# Patient Record
Sex: Male | Born: 1985 | Race: Black or African American | Hispanic: No | Marital: Single | Smoking: Current every day smoker
Health system: Southern US, Community
[De-identification: ages and names within clinical notes are randomized; demographics above are authoritative.]

---

## 2014-04-27 ENCOUNTER — Encounter (HOSPITAL_COMMUNITY): Payer: Self-pay | Admitting: Emergency Medicine

## 2014-04-27 ENCOUNTER — Emergency Department (HOSPITAL_COMMUNITY)
Admission: EM | Admit: 2014-04-27 | Discharge: 2014-04-27 | Disposition: A | Discharge status: Home / Self Care | Payer: Self-pay | Attending: Emergency Medicine | Admitting: Emergency Medicine

## 2014-04-27 DIAGNOSIS — K088 Other specified disorders of teeth and supporting structures: Secondary | ICD-10-CM | POA: Insufficient documentation

## 2014-04-27 DIAGNOSIS — K0889 Other specified disorders of teeth and supporting structures: Secondary | ICD-10-CM

## 2014-04-27 DIAGNOSIS — Z720 Tobacco use: Secondary | ICD-10-CM | POA: Insufficient documentation

## 2014-04-27 MED ORDER — ACETAMINOPHEN 325 MG PO TABS: 650.0000 mg | ORAL_TABLET | Freq: Four times a day (QID) | ORAL | Status: AC | PRN

## 2014-04-27 MED ORDER — PENICILLIN V POTASSIUM 250 MG PO TABS
500.0000 mg | ORAL_TABLET | Freq: Once | ORAL | Status: AC
  Administered 2014-04-27: 500 mg via ORAL
  Filled 2014-04-27: qty 2

## 2014-04-27 MED ORDER — ACETAMINOPHEN 325 MG PO TABS
325.0000 mg | ORAL_TABLET | Freq: Once | ORAL | Status: AC
  Administered 2014-04-27: 325 mg via ORAL
  Filled 2014-04-27: qty 1

## 2014-04-27 MED ORDER — HYDROCODONE-ACETAMINOPHEN 5-325 MG PO TABS
1.0000 | ORAL_TABLET | Freq: Once | ORAL | Status: AC
  Administered 2014-04-27: 1 via ORAL
  Filled 2014-04-27: qty 1

## 2014-04-27 MED ORDER — PENICILLIN V POTASSIUM 500 MG PO TABS: 500.0000 mg | ORAL_TABLET | Freq: Three times a day (TID) | ORAL | Status: DC

## 2014-04-27 NOTE — ED Provider Notes (Addendum)
CSN: [id number]     Arrival date & time 04/27/14  0127 History   First MD Initiated Contact with Patient 04/27/14 0505     Chief Complaint  Patient presents with  . Dental Pain      HPI Patient reports worsening left lower first molar pain over the past several days.  Patient has tried Tylenol without significant relief.  No fevers or chills.  No neck pain or neck stiffness.  No other complaints.   History reviewed. No pertinent past medical history. History reviewed. No pertinent past surgical history. No family history on file. History  Substance Use Topics  . Smoking status: Current Every Day Smoker  . Smokeless tobacco: Not on file  . Alcohol Use: Yes    Review of Systems  All other systems reviewed and are negative.     Allergies  Asa and Ibuprofen  Home Medications   Prior to Admission medications   Medication Sig Start Date End Date Taking? Authorizing Provider  acetaminophen (TYLENOL) 325 MG tablet Take 2 tablets (650 mg total) by mouth every 6 (six) hours as needed. 04/27/14   Azalia BilisKevin Roberth Berling, MD  penicillin v potassium (VEETID) 500 MG tablet Take 1 tablet (500 mg total) by mouth 3 (three) times daily. 04/27/14   Azalia BilisKevin Callen Zuba, MD   BP 130/80 mmHg  Pulse 55  Temp(Src) 98.2 F (36.8 C) (Oral)  Resp 18  Ht 5\' 10"  (1.778 m)  Wt 182 lb (82.555 kg)  BMI 26.11 kg/m2  SpO2 98% Physical Exam  Constitutional: He is oriented to person, place, and time. He appears well-developed and well-nourished.  HENT:  Head: Normocephalic.  Obvious dental decay and mild tenderness of the left lower first molar.  No gingival swelling or fluctuance.  Tolerating secretions.  Eyes: EOM are normal.  Neck: Normal range of motion.  Pulmonary/Chest: Effort normal.  Abdominal: He exhibits no distension.  Musculoskeletal: Normal range of motion.  Neurological: He is alert and oriented to person, place, and time.  Psychiatric: He has a normal mood and affect.  Nursing note and vitals  reviewed.   ED Course  Procedures (including critical care time) Labs Review Labs Reviewed - No data to display  Imaging Review No results found.   EKG Interpretation None      MDM   Final diagnoses:  Pain, dental    Dental Pain. Home with antibiotics and pain medicine. Recommend dental follow up. No signs of gingival abscess. Tolerating secretions. Airway patent. No sub lingular swelling     Azalia BilisKevin Laterra Lubinski, MD 04/27/14 [id number]  Azalia BilisKevin Kirtis Challis, MD 05/07/14 [phone]

## 2014-04-27 NOTE — ED Notes (Signed)
Pt. reports left lower molar pain for several days worse today unrelieved by OTC pain medication .

## 2014-04-27 NOTE — Discharge Instructions (Signed)

## 2015-04-06 ENCOUNTER — Encounter (HOSPITAL_COMMUNITY): Payer: Self-pay

## 2015-04-06 ENCOUNTER — Emergency Department (HOSPITAL_COMMUNITY)
Admission: EM | Admit: 2015-04-06 | Discharge: 2015-04-06 | Disposition: A | Discharge status: Home / Self Care | Payer: No Typology Code available for payment source | Attending: Emergency Medicine | Admitting: Emergency Medicine

## 2015-04-06 DIAGNOSIS — F17200 Nicotine dependence, unspecified, uncomplicated: Secondary | ICD-10-CM | POA: Insufficient documentation

## 2015-04-06 DIAGNOSIS — S3992XA Unspecified injury of lower back, initial encounter: Secondary | ICD-10-CM | POA: Diagnosis present

## 2015-04-06 DIAGNOSIS — Y9389 Activity, other specified: Secondary | ICD-10-CM | POA: Insufficient documentation

## 2015-04-06 DIAGNOSIS — Y92410 Unspecified street and highway as the place of occurrence of the external cause: Secondary | ICD-10-CM | POA: Diagnosis not present

## 2015-04-06 DIAGNOSIS — S39012A Strain of muscle, fascia and tendon of lower back, initial encounter: Secondary | ICD-10-CM | POA: Diagnosis not present

## 2015-04-06 DIAGNOSIS — Y998 Other external cause status: Secondary | ICD-10-CM | POA: Insufficient documentation

## 2015-04-06 DIAGNOSIS — Z792 Long term (current) use of antibiotics: Secondary | ICD-10-CM | POA: Insufficient documentation

## 2015-04-06 MED ORDER — HYDROCODONE-ACETAMINOPHEN 5-325 MG PO TABS
1.0000 | ORAL_TABLET | Freq: Once | ORAL | Status: AC
  Administered 2015-04-06: 1 via ORAL
  Filled 2015-04-06: qty 1

## 2015-04-06 MED ORDER — METHOCARBAMOL 500 MG PO TABS: 500.0000 mg | ORAL_TABLET | Freq: Three times a day (TID) | ORAL | Status: DC | PRN

## 2015-04-06 MED ORDER — HYDROCODONE-ACETAMINOPHEN 5-325 MG PO TABS: 1.0000 | ORAL_TABLET | ORAL | Status: DC | PRN

## 2015-04-06 MED ORDER — METHOCARBAMOL 500 MG PO TABS
1000.0000 mg | ORAL_TABLET | Freq: Once | ORAL | Status: AC
  Administered 2015-04-06: 1000 mg via ORAL
  Filled 2015-04-06: qty 2
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
## 2015-04-06 NOTE — Discharge Instructions (Signed)
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
## 2016-06-23 ENCOUNTER — Ambulatory Visit (INDEPENDENT_AMBULATORY_CARE_PROVIDER_SITE_OTHER): Payer: BLUE CROSS/BLUE SHIELD | Admitting: Family Medicine

## 2016-06-23 ENCOUNTER — Encounter: Payer: Self-pay | Admitting: Family Medicine

## 2016-06-23 VITALS — BP 132/86 | HR 75 | Temp 98.4°F | Resp 16 | Ht 70.0 in | Wt 196.0 lb

## 2016-06-23 DIAGNOSIS — G43709 Chronic migraine without aura, not intractable, without status migrainosus: Secondary | ICD-10-CM | POA: Diagnosis not present

## 2016-06-23 DIAGNOSIS — R030 Elevated blood-pressure reading, without diagnosis of hypertension: Secondary | ICD-10-CM | POA: Diagnosis not present

## 2016-06-23 MED ORDER — PROMETHAZINE HCL 25 MG/ML IJ SOLN
25.0000 mg | Freq: Once | INTRAMUSCULAR | Status: AC
  Administered 2016-06-23: 25 mg via INTRAMUSCULAR

## 2016-06-23 MED ORDER — ONDANSETRON 8 MG PO TBDP: 8.0000 mg | ORAL_TABLET | Freq: Three times a day (TID) | ORAL | 0 refills | Status: DC | PRN

## 2016-06-23 MED ORDER — METHYLPREDNISOLONE SODIUM SUCC 125 MG IJ SOLR
60.0000 mg | Freq: Once | INTRAMUSCULAR | Status: AC
  Administered 2016-06-23: 60 mg via INTRAMUSCULAR

## 2016-06-23 MED ORDER — SUMATRIPTAN SUCCINATE 100 MG PO TABS: 50.0000 mg | ORAL_TABLET | Freq: Once | ORAL | 5 refills | Status: DC

## 2016-06-23 MED ORDER — AMITRIPTYLINE HCL 25 MG PO TABS: 25.0000 mg | ORAL_TABLET | Freq: Every day | ORAL | 1 refills | Status: DC

## 2016-06-23 NOTE — Patient Instructions (Addendum)
   IF you received an x-ray today, you will receive an invoice from [facility]. Please contact [facility] at [phone] with questions or concerns regarding your invoice.   IF you received labwork today, you will receive an invoice from [facility]. Please contact [facility] at [phone] with questions or concerns regarding your invoice.   Our billing staff will not be able to assist you with questions regarding bills from these companies.  You will be contacted with the lab results as soon as they are available. The fastest way to get your results is to activate your My Chart account. Instructions are located on the last page of this paperwork. If you have not heard from us regarding the results in 2 weeks, please contact this office.     Recurrent Migraine Headache Migraines are a type of headache, and they are usually stronger and more sudden than normal headaches (tension headaches). Migraines are characterized by an intense pulsing, throbbing pain that is usually only present on one side of the head. Sometimes, migraine headaches can cause nausea, vomiting, sensitivity to light and sound, and vision changes. Recurrent migraines keep coming back (recurring). A migraine can last from 4 hours up to 3 days. What are the causes? The exact cause of this condition is not known. However, a migraine may be caused when nerves in the brain become irritated and release chemicals that cause inflammation of blood vessels. This inflammation causes pain. Certain things may also trigger migraines, such as:  A disruption in your regular eating and sleeping schedule.  Smoking.  Stress.  Menstruation.  Certain foods and drinks, such as: ? Aged cheese. ? Chocolate. ? Alcohol. ? Caffeine. ? Foods or drinks that contain nitrates, glutamate, aspartame, MSG, or tyramine.  Lack of sleep.  Hunger.  Physical exertion.  Fatigue.  High altitude.  Weather  changes.  Medicines, such as: ? Nitroglycerin, which is used to treat chest pain. ? Birth control pills. ? Estrogen. ? Some blood pressure medicines.  What are the signs or symptoms? Symptoms of this condition vary for each person and may include:  Pain that is usually only present on one side of the head. In some cases, the pain may be on both sides of the head or around the head or neck.  Pulsating or throbbing pain.  Severe pain that prevents daily activities.  Pain that is aggravated by any physical activity.  Nausea, vomiting, or both.  Dizziness.  Pain with exposure to bright lights, loud noises, or activity.  General sensitivity to bright lights, loud noises, or smells.  Before you get a migraine, you may get warning signs that a migraine is coming (aura). An aura may include:  Seeing flashing lights.  Seeing bright spots, halos, or zigzag lines.  Having tunnel vision or blurred vision.  Having numbness or a tingling feeling.  Having trouble talking.  Having muscle weakness.  Smelling a certain odor.  How is this diagnosed? This condition is often diagnosed based on:  Your symptoms and medical history.  A physical exam.  You may also have tests, including:  A CT scan or MRI of your brain. These imaging tests cannot diagnose migraines, but they can help to rule out other causes of headaches.  Blood tests.  How is this treated? This condition is treated with:  Medicines. These are used for: ? Lessening pain and nausea. ? Preventing recurrent migraines.  Lifestyle changes, such as changes to your diet or sleeping patterns.  Behavior therapy, such   as relaxation training or biofeedback. Biofeedback is a treatment that involves teaching you to relax and use your brain to lower your heart rate and control your breathing.  Follow these instructions at home: Medicines  Take over-the-counter and prescription medicines only as told by your health care  provider.  Do not drive or use heavy machinery while taking prescription pain medicine. Lifestyle  Do not use any products that contain nicotine or tobacco, such as cigarettes and e-cigarettes. If you need help quitting, ask your health care provider.  Limit alcohol intake to no more than 1 drink a day for nonpregnant women and 2 drinks a day for men. One drink equals 12 oz of beer, 5 oz of wine, or 1 oz of hard liquor.  Get 7-9 hours of sleep each night, or the amount of sleep recommended by your health care provider.  Limit your stress. Talk with your health care provider if you need help with stress management.  Maintain a healthy weight. If you need help losing weight, ask your health care provider.  Exercise regularly. Aim for 150 minutes of moderate-intensity exercise (walking, biking, yoga) or 75 minutes of vigorous exercise (running, circuit training, swimming) each week. General instructions  Keep a journal to find out what triggers your migraine headaches so you can avoid these triggers. For example, write down: ? What you eat and drink. ? How much sleep you get. ? Any change to your diet or medicines.  Lie down in a dark, quiet room when you have a migraine.  Try placing a cool towel over your head when you have a migraine.  Keep lights dim, if bright lights bother you and make your migraines worse.  Keep all follow-up visits as told by your health care provider. This is important. Contact a health care provider if:  Your pain does not improve, even with medicine.  Your migraines continue to return, even with medicine.  You have a fever.  You have weight loss. Get help right away if:  Your migraine becomes severe and medicine does not help.  You have a stiff neck.  You have a loss of vision.  You have muscle weakness or loss of muscle control.  You start losing your balance or have trouble walking.  You feel faint or you pass out.  You develop new,  severe symptoms.  You start having abrupt severe headaches that last for a second or less, like a thunderclap. Summary  Migraine headaches are usually stronger and more sudden than normal headaches (tension headaches). Migraines are characterized by an intense pulsing, throbbing pain that is usually only present on one side of the head.  The exact cause of this condition is not known. However, a migraine may be caused when nerves in the brain become irritated and release chemicals that cause inflammation of blood vessels.  Certain things may trigger migraines, such as changes to diet or sleeping patterns, smoking, certain foods, alcohol, stress, and certain medicines.  Sometimes, migraine headaches can cause nausea, vomiting, sensitivity to light and sound, and vision changes.  Migraines are often diagnosed based on your symptoms, medical history, and a physical exam. This information is not intended to replace advice given to you by your health care provider. Make sure you discuss any questions you have with your health care provider. Document Released: 09/12/2000 Document Revised: 09/30/2015 Document Reviewed: 09/30/2015 Elsevier Interactive Patient Education  2018 [employer] Inc[employer]  

## 2016-06-23 NOTE — Progress Notes (Signed)
Subjective:  By signing my name below, I, Ricky Carpenter, attest that this documentation has been prepared under the direction and in the presence of Ricky Sorenson, MD. Electronically Signed: Stann Carpenter, Scribe. 06/23/2016 , 3:24 PM .  Patient was seen in Room 9 .   Patient ID: Ricky Carpenter, male    DOB: 17-Oct-1985, 31 y.o.   MRN: [id number] Chief Complaint  Patient presents with  . Headache   HPI [name] is a 31 y.o. male who presents to Primary Care at [facility] complaining of headache migraine. Patient has been seen multiple times over the past several years at [name] for headaches. His most recent visit to [facility] was 5 days ago, when he presented with headache same day onset with nausea, vomiting and photosensitivity. His headaches usually respond well to tylenol. His symptoms were consistent with migraine, so he was treated with IM decadron, benadryl, and Reglan. After which, his headache had resolved, and patient was discharged home with 10 tablets of [name]. He has family history of migraines. Patient has never been seen by neurology nor had imaging of his head done for these migraines. His last meal was early this morning.   Patient states he's been having migraines as often as every other day with occasional associated dizziness, blurry vision, fatigue, vomiting and nausea. He's had them start since his teenage years. He's been taking 2 tablets of tylenol and wait 4-6 hours before taking more; however, it's not giving him relief anymore. He's been trying to wait the headache out until night time and takes benadryl. Last night, he had a bad migraine and took benadryl to go to sleep, but woke up with a mild headache this morning. He notes family history of migraines in his mother and his grandmother. He mentions having a slight migraine in office today, rating it 5/10.   He describes his headaches feeling "like a tension headache with  really hard bangs". His migraines usually start on one side and shifts to the other side after taking medication. His headache worsens with loud sounds and bright lights. He usually feels his headaches coming on with a mild point at the front of his head, causing him to "feel crazy". He has lighter headaches before it ramps up and would usually take tylenol. He denies seeing lines or weird smells when his migraines occur.   He wants to have preventative treatment for his migraines, as he works a full-time job and has children to take care of at home (31 year old and 31 year old). He's had taken [name] in the past, which gave him some relief, but it didn't completely knock out his headache. When he was [place], he was prescribed migraine medication when he visited the ER. But when he moved to [place], he hasn't been prescribed medication in the ER. He is concerned with his headaches because he had a friend die after complaining of headaches when patient was 31 years old.   He is allergic to ibuprofen and aspirin. He found out when he was in the ER at 31 years old. He had stitches placed and given ibuprofen. After taking ibuprofen, he started to have facial swelling and difficulty breathing. He was then given aspirin, but only worsened his swelling.   History reviewed. No pertinent past medical history. Prior to Admission medications   Medication Sig Start Date End Date Taking? Authorizing Provider  acetaminophen (TYLENOL) 325 MG tablet Take 2 tablets (  650 mg total) by mouth every 6 (six) hours as needed. 04/27/14   Azalia Bilis, MD  HYDROcodone-acetaminophen (NORCO/VICODIN) 5-325 MG tablet Take 1 tablet by mouth every 4 (four) hours as needed. 04/06/15   Rolland Porter, MD  methocarbamol (ROBAXIN) 500 MG tablet Take 1 tablet (500 mg total) by mouth 3 (three) times daily between meals as needed. 04/06/15   Rolland Porter, MD  penicillin v potassium (VEETID) 500 MG tablet Take 1 tablet (500 mg total) by mouth 3  (three) times daily. 04/27/14   Azalia Bilis, MD   Allergies  Allergen Reactions  . Asa [Aspirin] Anaphylaxis  . Ibuprofen Anaphylaxis   Review of Systems  Constitutional: Positive for fatigue. Negative for unexpected weight change.  Eyes: Negative for visual disturbance.  Respiratory: Negative for cough, chest tightness and shortness of breath.   Cardiovascular: Negative for chest pain, palpitations and leg swelling.  Gastrointestinal: Negative for abdominal pain and blood in stool.  Neurological: Positive for headaches. Negative for dizziness and light-headedness.       Objective:   Physical Exam  Constitutional: He is oriented to person, place, and time. He appears well-developed and well-nourished. No distress.  HENT:  Head: Normocephalic and atraumatic.  Left Ear: Tympanic membrane is injected and retracted.  Right ear impacted with cerumen  Eyes: EOM are normal. Pupils are equal, round, and reactive to light.  Neck: Neck supple.  Cardiovascular: Normal rate.   Pulmonary/Chest: Effort normal. No respiratory distress.  Musculoskeletal: Normal range of motion.  Neurological: He is alert and oriented to person, place, and time.  Skin: Skin is warm and dry.  Psychiatric: He has a normal mood and affect. His behavior is normal.  Nursing note and vitals reviewed.   BP 132/86   Pulse 75   Temp 98.4 F (36.9 C) (Oral)   Resp 16   Ht 5\' 10"  (1.778 m)   Wt 196 lb (88.9 kg)   SpO2 97%   BMI 28.12 kg/m      Assessment & Plan:   1. Chronic migraine without aura without status migrainosus, not intractable   2. Elevated blood pressure reading     Orders Placed This Encounter  Procedures  . 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    Answer:   No Sedation    Order Specific Question:   Does the patient have a pacemaker or implanted devices?    Answer:   No    Order Specific Question:   Preferred imaging location?    Answer:   [facility] (table limit-550lbs)    Order Specific Question:   Radiology Contrast Protocol - do NOT remove file path    Answer:   [email or web address]  . TSH  . CBC with Differential/Platelet  . Comprehensive metabolic panel  . Sedimentation Rate  . C-reactive protein  . Care order/instruction:    Scheduling Instructions:     Recheck BP    Meds ordered this encounter  Medications  . SUMAtriptan (IMITREX) 100 MG tablet    Sig: Take 0.5 tablets (50 mg total) by mouth once. May repeat in 2 hours if headache persists or recurs.    Dispense:  10 tablet    Refill:  5  . amitriptyline (ELAVIL) 25 MG tablet    Sig: Take 1 tablet (25 mg total) by mouth at bedtime.    Dispense:  30 tablet    Refill:  1  . ondansetron (ZOFRAN ODT) 8 MG disintegrating tablet    Sig: Take 1 tablet (8 mg total) by mouth every 8 (eight) hours as needed for nausea or vomiting.    Dispense:  20 tablet    Refill:  0  . promethazine (PHENERGAN) injection 25 mg  . methylPREDNISolone sodium succinate (SOLU-MEDROL) 125 mg/2 mL injection 60 mg    I personally performed the services described in this documentation, which was scribed in my presence. The recorded information has been reviewed and considered, and addended by me as needed.   Ricky SorensonEva Shaw, M.D.  [facility] at [facility]   [address] [place], [place] [address] [phone] phone [phone] fax  06/25/16 10:47 PM

## 2016-06-24 LAB — TSH: TSH: 0.979 u[IU]/mL (ref 0.450–4.500)

## 2016-06-24 LAB — COMPREHENSIVE METABOLIC PANEL
ALT: 20 IU/L (ref 0–44)
AST: 15 IU/L (ref 0–40)
Albumin/Globulin Ratio: 1.6 (ref 1.2–2.2)
Albumin: 4.1 g/dL (ref 3.5–5.5)
Alkaline Phosphatase: 75 IU/L (ref 39–117)
BUN/Creatinine Ratio: 9 (ref 9–20)
BUN: 8 mg/dL (ref 6–20)
Bilirubin Total: <0.2 mg/dL (ref 0.0–1.2)
CO2: 22 mmol/L (ref 20–29)
Calcium: 9.3 mg/dL (ref 8.7–10.2)
Chloride: 105 mmol/L (ref 96–106)
Creatinine, Ser: 0.94 mg/dL (ref 0.76–1.27)
GFR calc non Af Amer: 108 mL/min/{1.73_m2} (ref >59)
GFR, EST AFRICAN AMERICAN: 125 mL/min/{1.73_m2} (ref >59)
GLUCOSE: 107 mg/dL — AB (ref 65–99)
Globulin, Total: 2.6 g/dL (ref 1.5–4.5)
Potassium: 3.8 mmol/L (ref 3.5–5.2)
Sodium: 142 mmol/L (ref 134–144)
TOTAL PROTEIN: 6.7 g/dL (ref 6.0–8.5)

## 2016-06-24 LAB — CBC WITH DIFFERENTIAL/PLATELET
Basophils Absolute: 0 10*3/uL (ref 0.0–0.2)
Basos: 0 %
EOS (ABSOLUTE): 0.3 10*3/uL (ref 0.0–0.4)
Eos: 3 %
HEMOGLOBIN: 14.5 g/dL (ref 13.0–17.7)
Hematocrit: 41.8 % (ref 37.5–51.0)
IMMATURE GRANS (ABS): 0 10*3/uL (ref 0.0–0.1)
Immature Granulocytes: 0 %
LYMPHS: 38 %
Lymphocytes Absolute: 2.9 10*3/uL (ref 0.7–3.1)
MCH: 31 pg (ref 26.6–33.0)
MCHC: 34.7 g/dL (ref 31.5–35.7)
MCV: 90 fL (ref 79–97)
Monocytes Absolute: 0.4 10*3/uL (ref 0.1–0.9)
Monocytes: 6 %
NEUTROS PCT: 53 %
Neutrophils Absolute: 4 10*3/uL (ref 1.4–7.0)
Platelets: 232 10*3/uL (ref 150–379)
RBC: 4.67 x10E6/uL (ref 4.14–5.80)
RDW: 13.9 % (ref 12.3–15.4)
WBC: 7.6 10*3/uL (ref 3.4–10.8)

## 2016-06-24 LAB — SEDIMENTATION RATE: SED RATE: 6 mm/h (ref 0–15)

## 2016-06-24 LAB — C-REACTIVE PROTEIN: CRP: 2.4 mg/L (ref 0.0–4.9)

## 2016-06-28 ENCOUNTER — Telehealth: Payer: Self-pay | Admitting: Family Medicine

## 2016-06-28 DIAGNOSIS — G43709 Chronic migraine without aura, not intractable, without status migrainosus: Secondary | ICD-10-CM

## 2016-06-28 DIAGNOSIS — G4440 Drug-induced headache, not elsewhere classified, not intractable: Secondary | ICD-10-CM

## 2016-06-28 DIAGNOSIS — IMO0002 Reserved for concepts with insufficient information to code with codable children: Secondary | ICD-10-CM

## 2016-06-28 NOTE — Telephone Encounter (Signed)
Pt states that the medication SUMAtriptan (IMITREX) 100 MG tablet for his headaches is not working.  Please adv.  Contact number [phone]

## 2016-06-28 NOTE — Telephone Encounter (Signed)
Dr. Shaw, please see this message. Thanks. 

## 2016-06-29 MED ORDER — ELETRIPTAN HYDROBROMIDE 40 MG PO TABS: 40.0000 mg | ORAL_TABLET | ORAL | 0 refills | Status: DC | PRN

## 2016-06-29 NOTE — Telephone Encounter (Signed)
i'm sorry it's not helping.  I have sent in a new med - Relpax - for him to try when he has a headache instead of the imitrex. Because of his medication overuse pattern combined with decades of chronic migraine, we might have trouble finding something that will eradicate his headache entirely but need to first focus on finding what can at least reduce the intensity and frequency.  I will also go ahead and refer patient to neurology for further treatment since it sounds like the first line meds aren't working for him.  If headaches don't have much improvement in another week or two, One of the next steps would also be to start him on topamax (topiramate) daily in addition to current regimen to try further aims of preventing the HA in the first place.    How is he tolerating the amitriptyline? Any side effects?  Any change in headache pattern yet? (frequency, quality, intensity, duration, etc.)

## 2016-07-03 NOTE — Telephone Encounter (Signed)
Left message for patient to call back  

## 2016-07-03 NOTE — Telephone Encounter (Signed)
Spoke with patient and advised that new rx was sent into pharmacy and if he didn't receive any type of relief to give us a call back and we will send in the other rx that was discussed. Advised pt that a referral was made for neurology and either we would call or he would get a call from them when an apt was made. Pt verbalized understanding. Pt stated that he is not getting any relief from the amitriptyline besides it making him sleepy and he does not like that feeling. Pt stated that he has his headaches almost every other day and the intensity is about a 10 all day long. Pt verbalized understanding of new rx and the referral. No further questions.

## 2016-07-07 ENCOUNTER — Other Ambulatory Visit: Payer: Self-pay

## 2016-07-09 ENCOUNTER — Telehealth: Payer: Self-pay

## 2016-07-09 NOTE — Telephone Encounter (Signed)
PA started for ELEPTRIPTAN 40 MG TABLETS Awaiting Response (Key: [id number])   [facility] is reviewing your PA request and will respond within 24-72 hours. To check for an update later, open this request from your dashboard.

## 2016-07-10 NOTE — Telephone Encounter (Signed)
PA APPROVED for Eletriptan Pharmacy notified. Although it is covered by insurance, patient's copay is $300.

## 2016-07-11 NOTE — Telephone Encounter (Signed)
What are the triptans that are covered by his insurance. He has only failed sumatriptan so we can def switch him to another - just needs to know what his options are - what are on the lowest tier? Did they tell us or does pt need to call his insurance?

## 2016-07-12 NOTE — Telephone Encounter (Signed)
Please advise 

## 2016-07-13 ENCOUNTER — Telehealth: Payer: Self-pay | Admitting: Family Medicine

## 2016-07-13 NOTE — Telephone Encounter (Signed)
Authorization for MRI brain stem w/o dye Code [id number] certified on 07/03/2016  Reference # [id number]

## 2016-07-16 NOTE — Telephone Encounter (Signed)
Pt needs to call insurance. Left message on pt's voicemail to call back.

## 2016-07-25 MED ORDER — NARATRIPTAN HCL 2.5 MG PO TABS: ORAL_TABLET | ORAL | 5 refills | Status: DC

## 2016-07-25 NOTE — Telephone Encounter (Signed)
Notice from pharmacy that insurance preferred alternatives are sumatriptan (which pt just failed) and naratriptan so will try the latter.  Please let pt now that new med has been sent to pharm.

## 2016-07-26 NOTE — Telephone Encounter (Signed)
Patient made aware.

## 2016-08-08 DIAGNOSIS — G43711 Chronic migraine without aura, intractable, with status migrainosus: Secondary | ICD-10-CM | POA: Diagnosis not present

## 2016-08-21 ENCOUNTER — Ambulatory Visit (INDEPENDENT_AMBULATORY_CARE_PROVIDER_SITE_OTHER): Payer: BLUE CROSS/BLUE SHIELD | Admitting: Family Medicine

## 2016-08-21 ENCOUNTER — Encounter: Payer: Self-pay | Admitting: Family Medicine

## 2016-08-21 ENCOUNTER — Ambulatory Visit (INDEPENDENT_AMBULATORY_CARE_PROVIDER_SITE_OTHER): Payer: BLUE CROSS/BLUE SHIELD

## 2016-08-21 VITALS — BP 126/82 | HR 67 | Temp 98.2°F | Resp 17 | Ht 70.0 in | Wt 189.2 lb

## 2016-08-21 DIAGNOSIS — S99919A Unspecified injury of unspecified ankle, initial encounter: Secondary | ICD-10-CM | POA: Diagnosis not present

## 2016-08-21 DIAGNOSIS — M25572 Pain in left ankle and joints of left foot: Secondary | ICD-10-CM

## 2016-08-21 DIAGNOSIS — Z23 Encounter for immunization: Secondary | ICD-10-CM | POA: Diagnosis not present

## 2016-08-21 DIAGNOSIS — S99912A Unspecified injury of left ankle, initial encounter: Secondary | ICD-10-CM | POA: Diagnosis not present

## 2016-08-21 NOTE — Progress Notes (Signed)
Chief Complaint  Patient presents with  . Ankle Pain    left, tv fell on foot while moving on yesterday, pain level 9/10    HPI   Pt reports that at 2am he was carrying a tv about 36 inches that fell on his foot.  It was not in a box. It did not shatter the tv He now has a limp and pain Pain is 9/10 He reports that he iced the left ankle and wore an ace wrap right away to reduce the swelling. 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 No pertinent past medical history.  Current Outpatient Prescriptions  Medication Sig Dispense Refill  . acetaminophen (TYLENOL) 325 MG tablet Take 2 tablets (650 mg total) by mouth every 6 (six) hours as needed. 25 tablet 0   No current facility-administered medications for this visit.     Allergies:  Allergies  Allergen Reactions  . Asa [Aspirin] Anaphylaxis  . Ibuprofen Anaphylaxis    History reviewed. No pertinent surgical history.  Social History   Social History  . Marital status: Single    Spouse name: N/A  . Number of children: N/A  . Years of education: N/A   Social History Main Topics  . Smoking status: Current Every Day Smoker  . Smokeless tobacco: Never Used  . Alcohol use Yes  . Drug use: No  . Sexual activity: Not Asked   Other Topics Concern  . None   Social History Narrative  . None    ROS Review of Systems See HPI Constitution: No fevers or chills No malaise No diaphoresis Skin: No rash or itching Eyes: no blurry vision, no double vision GU: no dysuria or hematuria Neuro: no dizziness or headaches  Objective: Vitals:   08/21/16 1122  BP: 126/82  Pulse: 67  Resp: 17  Temp: 98.2 F (36.8 C)  TempSrc: Oral  SpO2: 99%  Weight: 189 lb 3.2 oz (85.8 kg)  Height: 5\' 10"  (1.778 m)    Physical Exam  Constitutional: He is oriented to person, place, and time. He appears well-developed and well-nourished.  HENT:  Head:  Normocephalic and atraumatic.  Eyes: Conjunctivae and EOM are normal.  Cardiovascular:  Pulses:      Dorsalis pedis pulses are 2+ on the right side, and 2+ on the left side.       Posterior tibial pulses are 2+ on the right side, and 2+ on the left side.  Pulmonary/Chest: Effort normal.  Musculoskeletal: Normal range of motion. He exhibits no edema.       Right ankle: Normal. He exhibits normal range of motion, no swelling and no ecchymosis.       Left ankle: He exhibits no swelling, no ecchymosis, no deformity and normal pulse. Tenderness. Lateral malleolus and AITFL tenderness found. No medial malleolus, no posterior TFL, no head of 5th metatarsal and no proximal fibula tenderness found. Achilles tendon exhibits no pain.  Neurological: He is alert and oriented to person, place, and time.   Tenderness along the lateral malleolus and over the dorsum of the foot Cap refill <2s   XRAY 08/21/16 No fracture of the ankle   Assessment and Plan Ricky Carpenter was seen today for ankle pain.  Diagnoses and all orders for this visit:  Pain of joint of left ankle and foot- discussed R. I. C. E Advised light duty and gave work note  -     DG  Ankle Complete Left  Need for Tdap vaccination -     Tdap vaccine greater than or equal to 7yo IM  Ankle injury, initial encounter -     DG Ankle Complete Left      Kerensa Nicklas A Creta Levin

## 2016-08-21 NOTE — Patient Instructions (Addendum)
Ankle Pain Many things can cause ankle pain, including an injury to the area and overuse of the ankle.The ankle joint holds your body weight and allows you to move around. Ankle pain can occur on either side or the back of one ankle or both ankles. Ankle pain may be sharp and burning or dull and aching. There may be tenderness, stiffness, redness, or warmth around the ankle. Follow these instructions at home: Activity  Rest your ankle as told by your health care provider. Avoid any activities that cause ankle pain.  Do exercises as told by your health care provider.  Ask your health care provider if you can drive. Using a brace, a bandage, or crutches  If you were given a brace: ? Wear it as told by your health care provider. ? Remove it when you take a bath or a shower. ? Try not to move your ankle very much, but wiggle your toes from time to time. This helps to prevent swelling.  If you were given an elastic bandage: ? Remove it when you take a bath or a shower. ? Try not to move your ankle very much, but wiggle your toes from time to time. This helps to prevent swelling. ? Adjust the bandage to make it more comfortable if it feels too tight. ? Loosen the bandage if you have numbness or tingling in your foot or if your foot turns cold and blue.  If you have crutches, use them as told by your health care provider. Continue to use them until you can walk without feeling pain in your ankle. Managing pain, stiffness, and swelling  Raise (elevate) your ankle above the level of your heart while you are sitting or lying down.  If directed, apply ice to the area: ? Put ice in a plastic bag. ? Place a towel between your skin and the bag. ? Leave the ice on for 20 minutes, 2-3 times per day. General instructions  Keep all follow-up visits as told by your health care provider. This is important.  Record this information that may be helpful for you and your health care provider: ? How  often you have ankle pain. ? Where the pain is located. ? What the pain feels like.  Take over-the-counter and prescription medicines only as told by your health care provider. Contact a health care provider if:  Your pain gets worse.  Your pain is not relieved with medicines.  You have a fever or chills.  You are having more trouble with walking.  You have new symptoms. Get help right away if:  Your foot, leg, toes, or ankle tingles or becomes numb.  Your foot, leg, toes, or ankle becomes swollen.  Your foot, leg, toes, or ankle turns pale or blue. This information is not intended to replace advice given to you by your health care provider. Make sure you discuss any questions you have with your health care provider. Document Released: 06/07/2009 Document Revised: 08/19/2015 Document Reviewed: 07/20/2014 Elsevier Interactive Patient Education  2017 [employer]  

## 2017-01-29 DIAGNOSIS — R51 Headache: Secondary | ICD-10-CM | POA: Diagnosis not present

## 2017-10-13 ENCOUNTER — Encounter (HOSPITAL_BASED_OUTPATIENT_CLINIC_OR_DEPARTMENT_OTHER): Payer: Self-pay | Admitting: Adult Health

## 2017-10-13 ENCOUNTER — Other Ambulatory Visit: Payer: Self-pay

## 2017-10-13 ENCOUNTER — Emergency Department (HOSPITAL_BASED_OUTPATIENT_CLINIC_OR_DEPARTMENT_OTHER): Payer: Self-pay

## 2017-10-13 ENCOUNTER — Emergency Department (HOSPITAL_BASED_OUTPATIENT_CLINIC_OR_DEPARTMENT_OTHER)
Admission: EM | Admit: 2017-10-13 | Discharge: 2017-10-13 | Disposition: A | Discharge status: Home / Self Care | Payer: Self-pay | Attending: Emergency Medicine | Admitting: Emergency Medicine

## 2017-10-13 DIAGNOSIS — R0789 Other chest pain: Secondary | ICD-10-CM | POA: Insufficient documentation

## 2017-10-13 DIAGNOSIS — F17200 Nicotine dependence, unspecified, uncomplicated: Secondary | ICD-10-CM | POA: Insufficient documentation

## 2017-10-13 LAB — BASIC METABOLIC PANEL
ANION GAP: 10 (ref 5–15)
BUN: 10 mg/dL (ref 6–20)
CO2: 27 mmol/L (ref 22–32)
CREATININE: 0.99 mg/dL (ref 0.61–1.24)
Calcium: 9.4 mg/dL (ref 8.9–10.3)
Chloride: 101 mmol/L (ref 98–111)
Glucose, Bld: 101 mg/dL — ABNORMAL HIGH (ref 70–99)
Potassium: 4.1 mmol/L (ref 3.5–5.1)
SODIUM: 138 mmol/L (ref 135–145)

## 2017-10-13 LAB — CBC
HCT: 43.6 % (ref 39.0–52.0)
Hemoglobin: 14.5 g/dL (ref 13.0–17.0)
MCH: 30.5 pg (ref 26.0–34.0)
MCHC: 33.3 g/dL (ref 30.0–36.0)
MCV: 91.6 fL (ref 80.0–100.0)
NRBC: 0 % (ref 0.0–0.2)
Platelets: 230 10*3/uL (ref 150–400)
RBC: 4.76 MIL/uL (ref 4.22–5.81)
RDW: 13.1 % (ref 11.5–15.5)
WBC: 8.6 10*3/uL (ref 4.0–10.5)

## 2017-10-13 LAB — TROPONIN I

## 2017-10-13 MED ORDER — MORPHINE SULFATE (PF) 4 MG/ML IV SOLN
6.0000 mg | Freq: Once | INTRAVENOUS | Status: AC
  Administered 2017-10-13: 6 mg via INTRAVENOUS
  Filled 2017-10-13: qty 2

## 2017-10-13 NOTE — ED Provider Notes (Addendum)
[facility] EMERGENCY DEPARTMENT Provider Note   CSN: [id number] Arrival date & time: 10/13/17  1925     History   Chief Complaint Chief Complaint  Patient presents with  . Chest Pain    HPI Ricky Carpenter is a 32 y.o. male.  HPI Signs of pain under left scapula which is around the anterior chest onset yesterday morning, becoming progressively worse.  Pain is worse with changing positions deep inspiration or with eating or drinking.  No trauma no injury.  No shortness of breath no fever.  No other associated symptoms.  Treated with Tylenol with partial relief.  No nausea or vomiting.  No sweatiness. History reviewed. No pertinent past medical history. Past medical history negative There are no active problems to display for this patient.   History reviewed. No pertinent surgical history.      Home Medications    Prior to Admission medications   Medication Sig Start Date End Date Taking? Authorizing Provider  acetaminophen (TYLENOL) 325 MG tablet Take 2 tablets (650 mg total) by mouth every 6 (six) hours as needed. 04/27/14   Azalia Bilis, MD    Family History History reviewed. No pertinent family history.  negative for cardiac disease Social History Social History   Tobacco Use  . Smoking status: Current Every Day Smoker  . Smokeless tobacco: Never Used  Substance Use Topics  . Alcohol use: Not Currently  . Drug use: No     Allergies   Asa [aspirin] and Ibuprofen   Review of Systems Review of Systems  Constitutional: Negative.   HENT: Negative.   Respiratory: Negative.   Cardiovascular: Positive for chest pain.  Gastrointestinal: Negative.   Musculoskeletal: Negative.   Skin: Negative.   Neurological: Negative.   Psychiatric/Behavioral: Negative.   All other systems reviewed and are negative.    Physical Exam Updated Vital Signs BP 133/83   Pulse 97   Temp 99.3 F (37.4 C) (Oral)   Resp 18   Ht 5\' 11"  (1.803 m)   Wt 90.7 kg    SpO2 99%   BMI 27.89 kg/m   Physical Exam  Constitutional: He appears well-developed and well-nourished. He appears distressed.  Appears uncomfortable  HENT:  Head: Normocephalic and atraumatic.  Eyes: Pupils are equal, round, and reactive to light. Conjunctivae are normal.  Neck: Neck supple. No tracheal deviation present. No thyromegaly present.  Cardiovascular: Normal rate, regular rhythm, normal heart sounds and intact distal pulses.  No murmur heard. Pulmonary/Chest: Effort normal and breath sounds normal. He exhibits tenderness.  Chest is tender right-sided anteriorly.  Pain is easily reproduced by forcible abduction of right shoulder  Abdominal: Soft. Bowel sounds are normal. He exhibits no distension. There is no tenderness.  Musculoskeletal: Normal range of motion. He exhibits no edema or tenderness.  Neurological: He is alert. Coordination normal.  Skin: Skin is warm and dry. No rash noted.  Psychiatric: He has a normal mood and affect.  Nursing note and vitals reviewed.    ED Treatments / Results  Labs (all labs ordered are listed, but only abnormal results are displayed) Labs Reviewed  BASIC METABOLIC PANEL  CBC  TROPONIN I    EKG EKG Interpretation  Date/Time:  Sunday October 13 2017 19:42:30 EDT Ventricular Rate:  100 PR Interval:  170 QRS Duration: 88 QT Interval:  318 QTC Calculation: 410 R Axis:   75 Text Interpretation:  Normal sinus rhythm Possible Inferior infarct , age undetermined Abnormal ECG No old tracing to compare  Confirmed by Doug Sou [phone]) on 10/13/2017 7:46:34 PM   Radiology Dg Chest 2 View  Result Date: 10/13/2017 CLINICAL DATA:  Back and chest pain. EXAM: CHEST - 2 VIEW COMPARISON:  None. FINDINGS: Normal heart size. Normal mediastinal contour. No pneumothorax. No pleural effusion. Low lung volumes. No pulmonary edema. Mild hazy right lung base opacity, favor atelectasis. IMPRESSION: Low lung volumes with mild hazy right lung  base opacity, favor atelectasis. Electronically Signed   By: Delbert Phenix M.D.   On: 10/13/2017 19:57    Procedures Procedures (including critical care time) 10:10 PM he feels improved after treatment with IV morphine. Medications Ordered in ED Medications  morphine 4 MG/ML injection 6 mg (has no administration in time range)   [place] Controlled Substance reporting System queried Received prescription of Suboxone 60 tablets 30-day supply on 09/20/2017.  Further prescriptions written.  Exam is most consistent with chest wall pain. Dan referral primary care.  Tylenol for pain. Initial Impression / Assessment and Plan / ED Course  I have reviewed the triage vital signs and the nursing notes.  Pertinent labs & imaging results that were available during my care of the patient were reviewed by me and considered in my medical decision making (see chart for details).     Exam is most consistent with musculoskeletal chest pain. Pain is highly atypical for aortic dissection or pulmonary embolism.  No shortness of breath.  Pain worse with changing positions. Final Clinical Impressions(s) / ED Diagnoses  Diagnosis atypical chest pain Final diagnoses:  None    ED Discharge Orders    None       Doug Sou, MD 10/13/17 2219    Doug Sou, MD 10/13/17 2220

## 2017-10-13 NOTE — ED Triage Notes (Addendum)
PResents with lower right sided shoulder blade pain that radiates to right chest and  is worse with deep inspiration and began yesterday. He tried Ityelenol with no relief. Today the pain is severe. The pain is constant. Movement makes it worse as well as deep inspiration. Pain is described as sharp.

## 2017-10-13 NOTE — Discharge Instructions (Addendum)
You can continue Suboxone for pain as directed.  You can also take Tylenol for pain.  Follow-up with your primary care physician if pain continues in a week or you can call the number on these instructions to get a primary care physician.

## 2017-10-13 NOTE — ED Notes (Signed)
Pt reports right sided cp continues and is worse when he inhales.

## 2017-10-13 NOTE — ED Notes (Signed)
Pt became upset w/ EDP regarding taking tylenol at home to manage pain and refused to sign d/c paperwork.

## 2018-01-07 DIAGNOSIS — H538 Other visual disturbances: Secondary | ICD-10-CM | POA: Diagnosis not present

## 2018-01-07 DIAGNOSIS — R112 Nausea with vomiting, unspecified: Secondary | ICD-10-CM | POA: Diagnosis not present

## 2018-01-07 DIAGNOSIS — G43909 Migraine, unspecified, not intractable, without status migrainosus: Secondary | ICD-10-CM | POA: Diagnosis not present

## 2019-01-17 IMAGING — CR DG CHEST 2V
2 series · 2 of 2 positions shown · non-contrast
Comparison: None.

CLINICAL DATA: Back and chest pain.

EXAM:
CHEST - 2 VIEW

[w chest pa]
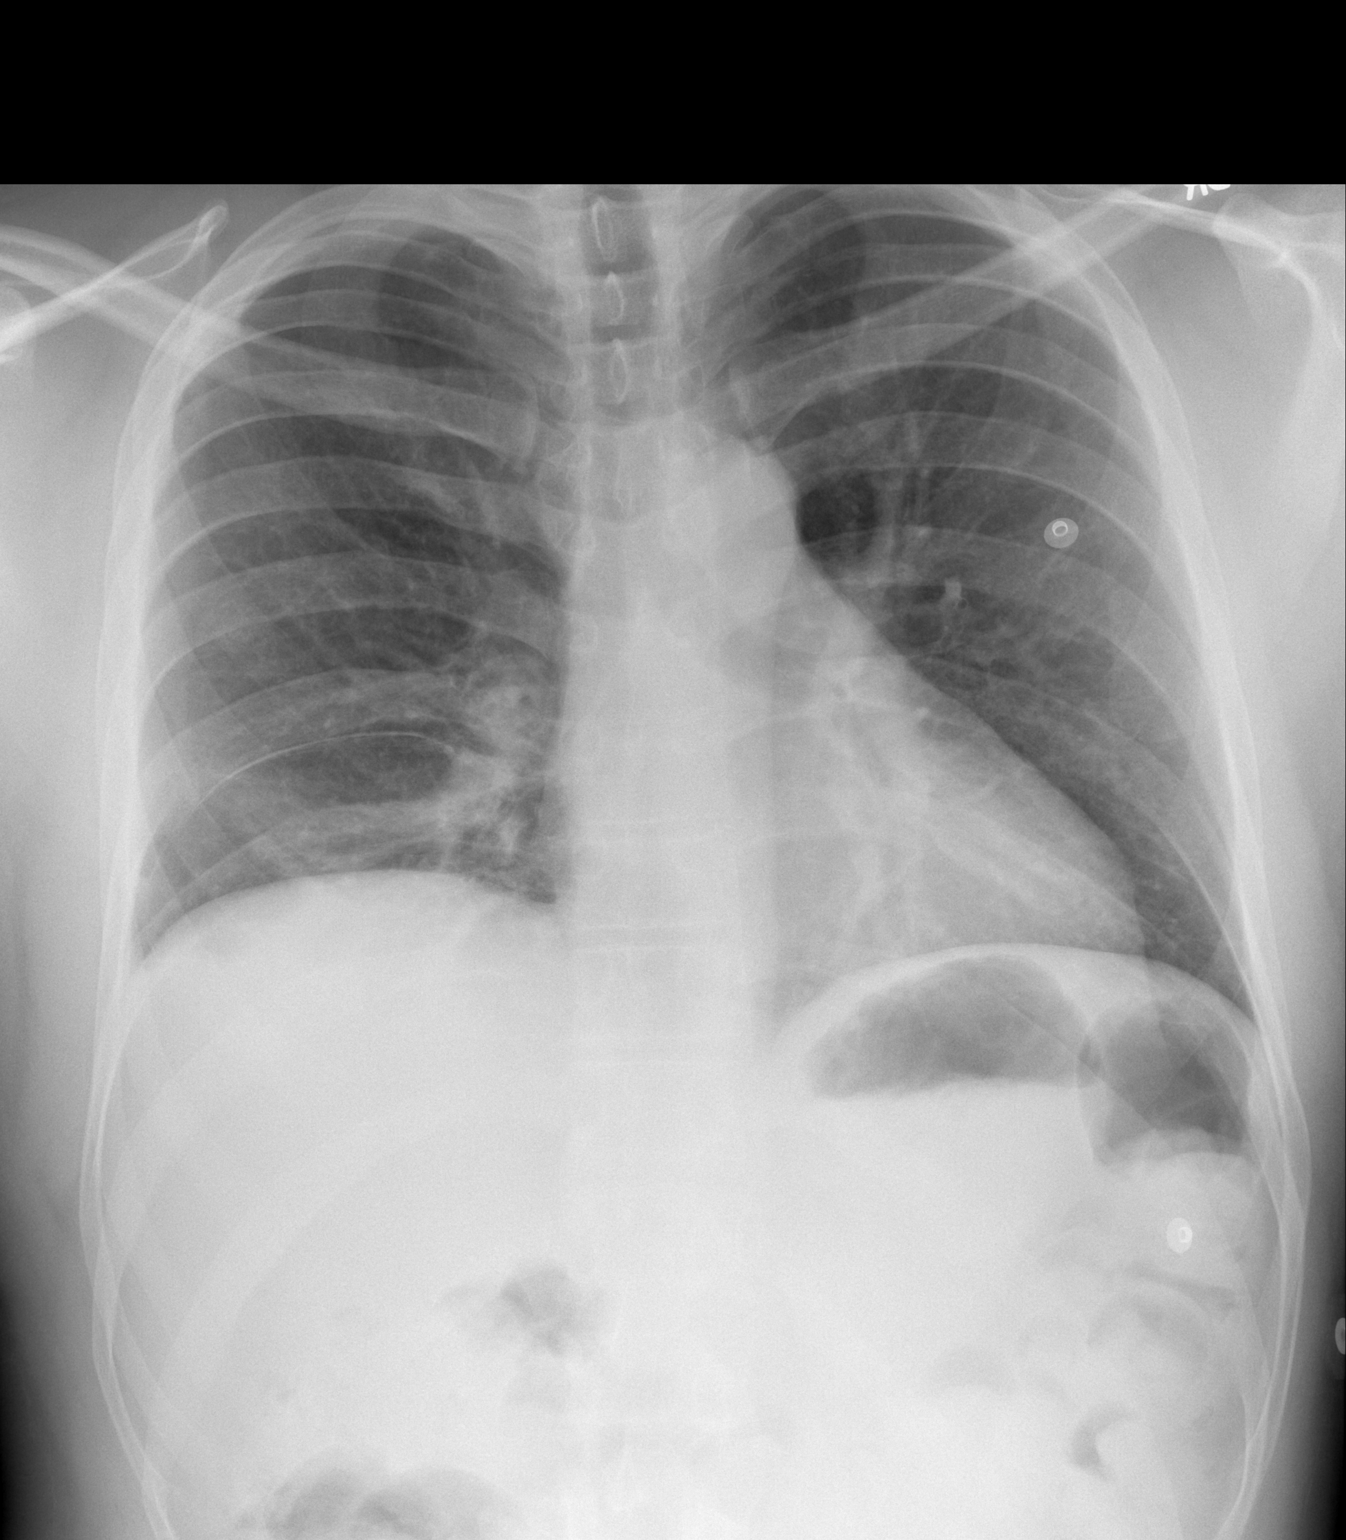

[w chest lat]
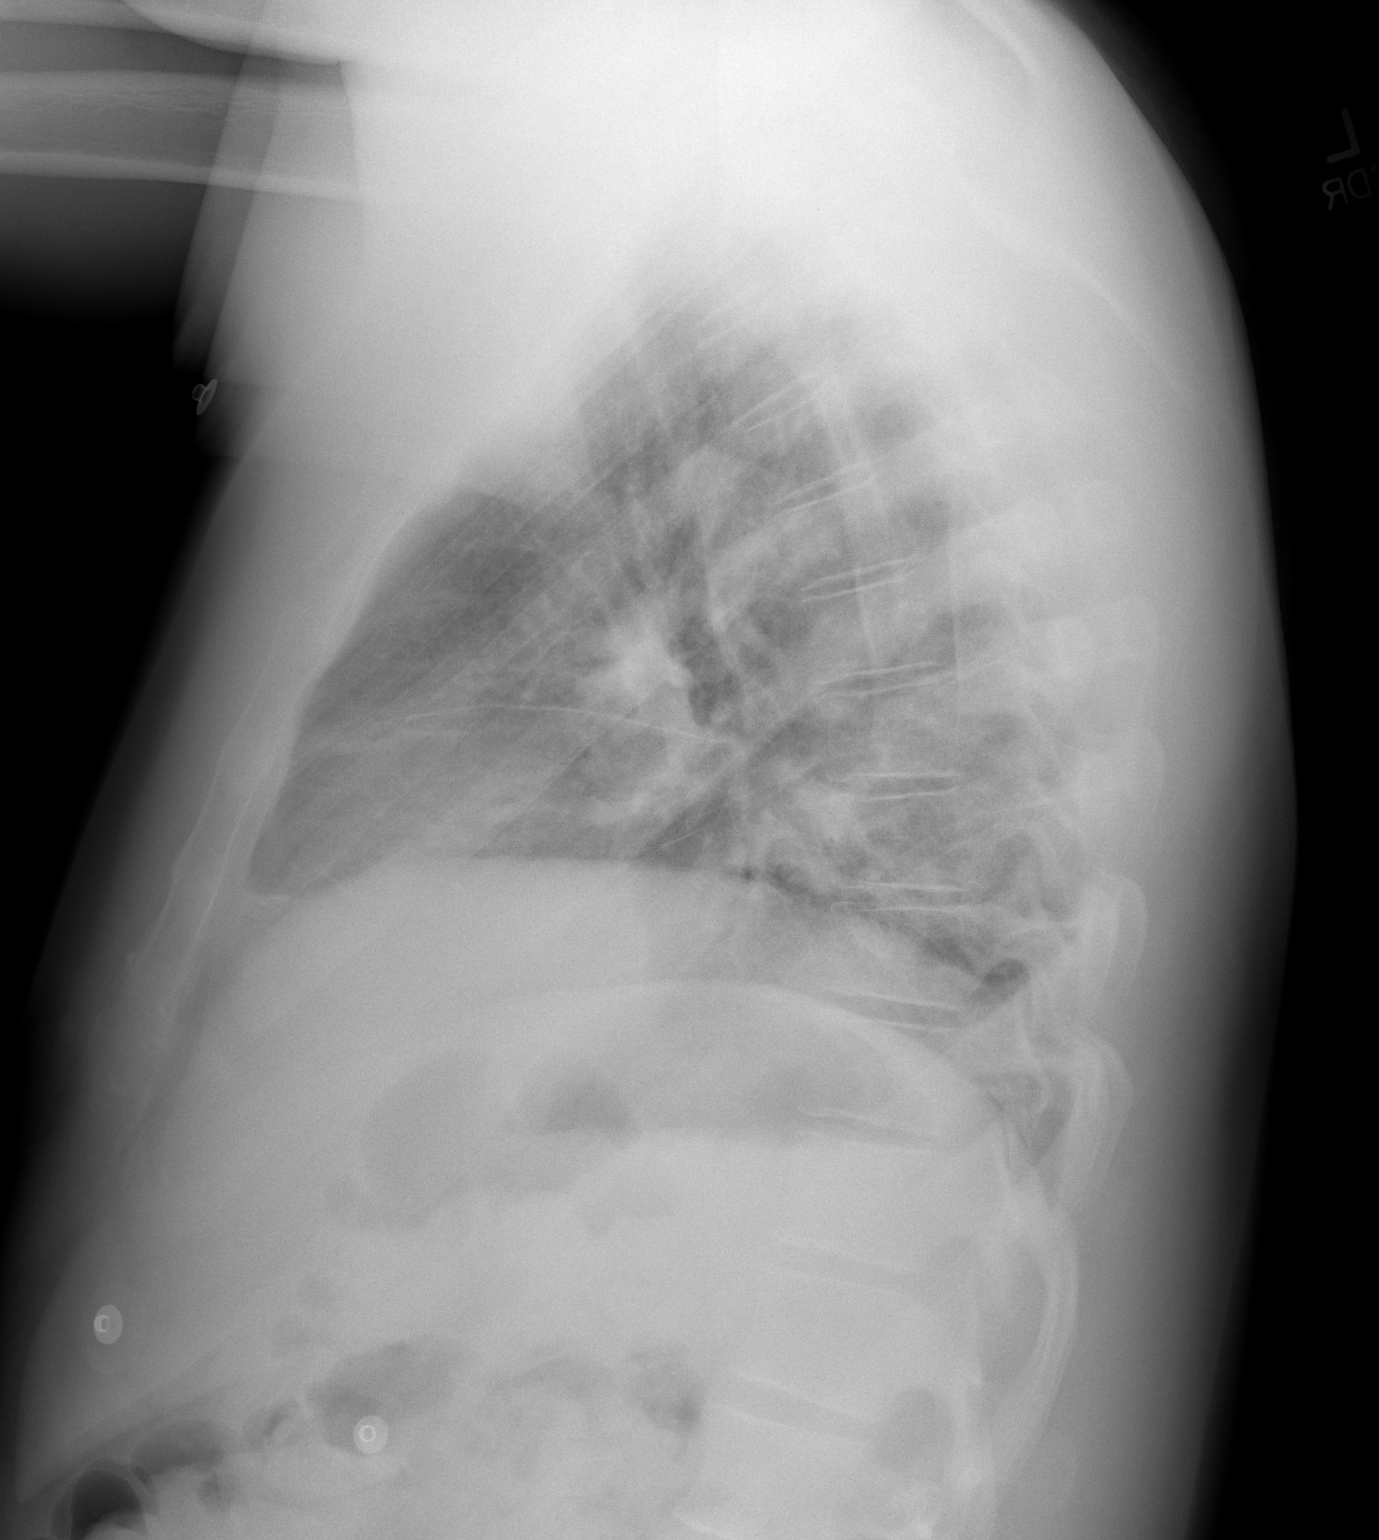

[2 of 2 positions shown; findings below may reference images not displayed]

FINDINGS: Normal heart size. Normal mediastinal contour. No pneumothorax. No
pleural effusion. Low lung volumes. No pulmonary edema. Mild hazy
right lung base opacity, favor atelectasis.
IMPRESSION: Low lung volumes with mild hazy right lung base opacity, favor
atelectasis.
